# Patient Record
Sex: Female | Born: 1935 | Hispanic: No | Smoking: Never smoker
Health system: Southern US, Community
[De-identification: ages and names within clinical notes are randomized; demographics above are authoritative.]

## PROBLEM LIST (undated history)

## (undated) DIAGNOSIS — F32A Depression, unspecified: Secondary | ICD-10-CM

## (undated) DIAGNOSIS — E7800 Pure hypercholesterolemia, unspecified: Secondary | ICD-10-CM

## (undated) DIAGNOSIS — F0390 Unspecified dementia without behavioral disturbance: Secondary | ICD-10-CM

## (undated) DIAGNOSIS — F329 Major depressive disorder, single episode, unspecified: Secondary | ICD-10-CM

---

## 2010-02-21 ENCOUNTER — Ambulatory Visit (HOSPITAL_BASED_OUTPATIENT_CLINIC_OR_DEPARTMENT_OTHER): Admission: RE | Admit: 2010-02-21 | Discharge: 2010-02-21 | Payer: Self-pay | Admitting: Orthopedic Surgery

## 2010-02-21 ENCOUNTER — Ambulatory Visit: Payer: Self-pay | Admitting: Diagnostic Radiology

## 2010-04-26 ENCOUNTER — Encounter
Admission: RE | Admit: 2010-04-26 | Discharge: 2010-06-26 | Payer: Self-pay | Source: Home / Self Care | Attending: Orthopedic Surgery | Admitting: Orthopedic Surgery

## 2010-07-02 ENCOUNTER — Encounter
Admission: RE | Admit: 2010-07-02 | Discharge: 2010-07-06 | Payer: Self-pay | Source: Home / Self Care | Attending: Orthopedic Surgery | Admitting: Orthopedic Surgery

## 2010-07-05 ENCOUNTER — Encounter: Admit: 2010-07-05 | Payer: Self-pay | Admitting: Orthopedic Surgery

## 2015-12-10 ENCOUNTER — Emergency Department (HOSPITAL_BASED_OUTPATIENT_CLINIC_OR_DEPARTMENT_OTHER): Payer: Medicare Other

## 2015-12-10 ENCOUNTER — Emergency Department (HOSPITAL_BASED_OUTPATIENT_CLINIC_OR_DEPARTMENT_OTHER)
Admission: EM | Admit: 2015-12-10 | Discharge: 2015-12-10 | Disposition: A | Discharge status: Home / Self Care | Payer: Medicare Other | Attending: Emergency Medicine | Admitting: Emergency Medicine

## 2015-12-10 ENCOUNTER — Encounter (HOSPITAL_BASED_OUTPATIENT_CLINIC_OR_DEPARTMENT_OTHER): Payer: Self-pay | Admitting: *Deleted

## 2015-12-10 DIAGNOSIS — Z79891 Long term (current) use of opiate analgesic: Secondary | ICD-10-CM | POA: Insufficient documentation

## 2015-12-10 DIAGNOSIS — Y9301 Activity, walking, marching and hiking: Secondary | ICD-10-CM | POA: Diagnosis not present

## 2015-12-10 DIAGNOSIS — Y92009 Unspecified place in unspecified non-institutional (private) residence as the place of occurrence of the external cause: Secondary | ICD-10-CM | POA: Diagnosis not present

## 2015-12-10 DIAGNOSIS — Y999 Unspecified external cause status: Secondary | ICD-10-CM | POA: Diagnosis not present

## 2015-12-10 DIAGNOSIS — S0101XA Laceration without foreign body of scalp, initial encounter: Secondary | ICD-10-CM | POA: Diagnosis not present

## 2015-12-10 DIAGNOSIS — F329 Major depressive disorder, single episode, unspecified: Secondary | ICD-10-CM | POA: Diagnosis not present

## 2015-12-10 DIAGNOSIS — E7800 Pure hypercholesterolemia, unspecified: Secondary | ICD-10-CM | POA: Insufficient documentation

## 2015-12-10 DIAGNOSIS — M25511 Pain in right shoulder: Secondary | ICD-10-CM | POA: Insufficient documentation

## 2015-12-10 DIAGNOSIS — Z79899 Other long term (current) drug therapy: Secondary | ICD-10-CM | POA: Diagnosis not present

## 2015-12-10 DIAGNOSIS — W19XXXA Unspecified fall, initial encounter: Secondary | ICD-10-CM

## 2015-12-10 DIAGNOSIS — F0390 Unspecified dementia without behavioral disturbance: Secondary | ICD-10-CM | POA: Insufficient documentation

## 2015-12-10 DIAGNOSIS — W1839XA Other fall on same level, initial encounter: Secondary | ICD-10-CM | POA: Insufficient documentation

## 2015-12-10 HISTORY — DX: Unspecified dementia, unspecified severity, without behavioral disturbance, psychotic disturbance, mood disturbance, and anxiety: F03.90

## 2015-12-10 HISTORY — DX: Pure hypercholesterolemia, unspecified: E78.00

## 2015-12-10 HISTORY — DX: Depression, unspecified: F32.A

## 2015-12-10 HISTORY — DX: Major depressive disorder, single episode, unspecified: F32.9

## 2015-12-10 NOTE — ED Notes (Signed)
Pt reports that she fell at home today.  Has a laceration with no active bleeding noted.  Pt reports that she remembers everything that happened.  Denies LOC.

## 2015-12-10 NOTE — ED Provider Notes (Addendum)
CSN: [id number]     Arrival date & time 12/10/15  1914 History  By signing my name below, I, Suzanne Wright, attest that this documentation has been prepared under the direction and in the presence of Tilden FossaElizabeth Karinne Schmader, MD. Electronically Signed: Soijett Wright, ED Scribe. 12/10/2015. 9:26 PM.   Chief Complaint  Patient presents with  . Fall      The history is provided by the patient. No language interpreter was used.    Suzanne Wright is a 82 y.o. female who presents to the Emergency Department complaining of a fall onset 4 PM today. Pt notes that she fell to her side while ambulating to her kitchen counter. Pt reports that she lives by herself. Pt is having associated symptoms of hitting her head, right shoulder pain, laceration to left sided head, and right knee bruising/pain due to previous fall. She notes that she has not tried any medications for the relief of her symptoms. She denies CP, dizziness, LOC, vomiting, and any other symptoms. Denies being on blood thinners at this time.    Past Medical History  Diagnosis Date  . Hypercholesteremia   . Dementia   . Depression    History reviewed. No pertinent past surgical history. History reviewed. No pertinent family history. Social History  Substance Use Topics  . Smoking status: Never Smoker   . Smokeless tobacco: None  . Alcohol Use: No   OB History    No data available     Review of Systems  Cardiovascular: Negative for chest pain.  Gastrointestinal: Negative for vomiting.  Musculoskeletal: Positive for arthralgias (right shoulder).  Skin: Positive for wound (laceration to left sided head).  Neurological: Negative for dizziness and syncope.  All other systems reviewed and are negative.     Allergies  Review of patient's allergies indicates no known allergies.  Home Medications   Prior to Admission medications   Medication Sig Start Date End Date Taking? Authorizing Provider  ALPRAZolam Prudy Feeler(XANAX) 1 MG tablet Take  1 mg by mouth at bedtime as needed for anxiety.   Yes Historical Provider, MD  atorvastatin (LIPITOR) 40 MG tablet Take 40 mg by mouth daily.   Yes Historical Provider, MD  buPROPion (WELLBUTRIN) 75 MG tablet Take 75 mg by mouth 2 (two) times daily.   Yes Historical Provider, MD  donepezil (ARICEPT) 10 MG tablet Take 10 mg by mouth at bedtime.   Yes Historical Provider, MD  fluticasone (FLOVENT DISKUS) 50 MCG/BLIST diskus inhaler Inhale 1 puff into the lungs 2 (two) times daily.   Yes Historical Provider, MD  gabapentin (NEURONTIN) 300 MG capsule Take 300 mg by mouth 3 (three) times daily.   Yes Historical Provider, MD  meclizine (ANTIVERT) 25 MG tablet Take 25 mg by mouth 3 (three) times daily as needed for dizziness.   Yes Historical Provider, MD  sertraline (ZOLOFT) 50 MG tablet Take 50 mg by mouth daily.   Yes Historical Provider, MD  traMADol (ULTRAM) 50 MG tablet Take by mouth every 6 (six) hours as needed.   Yes Historical Provider, MD   BP 138/64 mmHg  Pulse 65  Temp(Src) 97.8 F (36.6 C) (Oral)  Resp 16  Ht 5\' 1"  (1.549 m)  Wt 125 lb (56.7 kg)  BMI 23.63 kg/m2  SpO2 98% Physical Exam  Constitutional: She is oriented to person, place, and time. She appears well-developed and well-nourished.  HENT:  Head: Normocephalic. Head is with laceration.  2 cm linear laceration to left posterior scalp. Small abrasion  to left posterior scalp.  Cardiovascular: Normal rate and regular rhythm.   No murmur heard. Pulmonary/Chest: Effort normal and breath sounds normal. No respiratory distress.  Abdominal: Soft. There is no tenderness. There is no rebound and no guarding.  Musculoskeletal: She exhibits no edema or tenderness.  No C, T, L spinal tenderness. No shoulder tenderness.  Ecchymosis to the right knee with ROM intact, no local tenderness  Neurological: She is alert and oriented to person, place, and time.  Skin: Skin is warm and dry.  Psychiatric: She has a normal mood and affect. Her  behavior is normal.  Nursing note and vitals reviewed.   ED Course  Procedures (including critical care time) DIAGNOSTIC STUDIES: Oxygen Saturation is 97% on RA, nl by my interpretation.    COORDINATION OF CARE: 9:24 PM Discussed treatment plan with pt at bedside which includes laceration repaired, CT head, CT neck, and pt agreed to plan.  LACERATION REPAIR PROCEDURE NOTE The patient's identification was confirmed and consent was obtained. This procedure was performed by Tilden Fossa, MD at 9:21 PM. Site: left posterior scalp   Anesthetic used (type and amt): N/A Staples: 2 Length: 2 cm Technique:Single interrupted Complexity: SImple Antibx ointment applied: N/A Tetanus UTD or ordered: N/A Site irrigated with sterile water, explored without evidence of foreign body, wound well approximated.  Patient tolerated procedure well without complications. Instructions for care discussed verbally and patient provided with additional written instructions for homecare and f/u.   Labs Review Labs Reviewed - No data to display  Imaging Review Ct Head Wo Contrast  12/10/2015  CLINICAL DATA:  Status post fall, hitting head, with laceration at the left side of the head. Right shoulder pain. Concern for cervical spine injury. Initial encounter. EXAM: CT HEAD WITHOUT CONTRAST CT CERVICAL SPINE WITHOUT CONTRAST TECHNIQUE: Multidetector CT imaging of the head and cervical spine was performed following the standard protocol without intravenous contrast. Multiplanar CT image reconstructions of the cervical spine were also generated. COMPARISON:  CT of the head and cervical spine performed 06/07/2015 FINDINGS: CT HEAD FINDINGS There is no evidence of acute infarction, mass lesion, or intra- or extra-axial hemorrhage on CT. Prominence of the ventricles and sulci reflects mild to moderate cortical volume loss. Mild cerebellar atrophy is noted. Scattered periventricular and subcortical white matter change  likely reflects small vessel ischemic microangiopathy. Small chronic lacunar infarcts are seen in the basal ganglia bilaterally. The brainstem and fourth ventricle are within normal limits. The cerebral hemispheres demonstrate grossly normal gray-white differentiation. No mass effect or midline shift is seen. There is no evidence of fracture; visualized osseous structures are unremarkable in appearance. The orbits are within normal limits. There is mild partial opacification of the right mastoid air cells. The paranasal sinuses and left mastoid air cells are well-aerated. Minimal soft tissue swelling is noted overlying the high left parietal calvarium, with minimal soft tissue air and skin staples. CT CERVICAL SPINE FINDINGS There is no evidence of acute fracture or subluxation. Mild degenerative change is noted about the dens. Multilevel disc space narrowing is noted along the lower cervical spine, with scattered anterior and posterior disc osteophyte complexes, and underlying facet disease. There is mild grade 1 retrolisthesis of C5 on C6 and of C6 on C7. Vertebral bodies demonstrate normal height. Prevertebral soft tissues are within normal limits. The thyroid gland is unremarkable in appearance. The visualized lung apices are clear. No significant soft tissue abnormalities are seen. IMPRESSION: 1. No evidence of traumatic intracranial injury or fracture. 2.  No evidence of acute fracture or subluxation along the cervical spine. 3. Minimal soft tissue swelling overlying the high left parietal calvarium, with minimal soft tissue air and skin staples. 4. Mild to moderate cortical volume loss and scattered small vessel ischemic microangiopathy. 5. Small chronic lacunar infarcts at the basal ganglia bilaterally. 6. Mild degenerative change along the lower cervical spine. 7. Mild partial opacification of the right mastoid air cells. Electronically Signed   By: Roanna Raider M.D.   On: 12/10/2015 22:10   Ct Cervical  Spine Wo Contrast  12/10/2015  CLINICAL DATA:  Status post fall, hitting head, with laceration at the left side of the head. Right shoulder pain. Concern for cervical spine injury. Initial encounter. EXAM: CT HEAD WITHOUT CONTRAST CT CERVICAL SPINE WITHOUT CONTRAST TECHNIQUE: Multidetector CT imaging of the head and cervical spine was performed following the standard protocol without intravenous contrast. Multiplanar CT image reconstructions of the cervical spine were also generated. COMPARISON:  CT of the head and cervical spine performed 06/07/2015 FINDINGS: CT HEAD FINDINGS There is no evidence of acute infarction, mass lesion, or intra- or extra-axial hemorrhage on CT. Prominence of the ventricles and sulci reflects mild to moderate cortical volume loss. Mild cerebellar atrophy is noted. Scattered periventricular and subcortical white matter change likely reflects small vessel ischemic microangiopathy. Small chronic lacunar infarcts are seen in the basal ganglia bilaterally. The brainstem and fourth ventricle are within normal limits. The cerebral hemispheres demonstrate grossly normal gray-white differentiation. No mass effect or midline shift is seen. There is no evidence of fracture; visualized osseous structures are unremarkable in appearance. The orbits are within normal limits. There is mild partial opacification of the right mastoid air cells. The paranasal sinuses and left mastoid air cells are well-aerated. Minimal soft tissue swelling is noted overlying the high left parietal calvarium, with minimal soft tissue air and skin staples. CT CERVICAL SPINE FINDINGS There is no evidence of acute fracture or subluxation. Mild degenerative change is noted about the dens. Multilevel disc space narrowing is noted along the lower cervical spine, with scattered anterior and posterior disc osteophyte complexes, and underlying facet disease. There is mild grade 1 retrolisthesis of C5 on C6 and of C6 on C7. Vertebral  bodies demonstrate normal height. Prevertebral soft tissues are within normal limits. The thyroid gland is unremarkable in appearance. The visualized lung apices are clear. No significant soft tissue abnormalities are seen. IMPRESSION: 1. No evidence of traumatic intracranial injury or fracture. 2. No evidence of acute fracture or subluxation along the cervical spine. 3. Minimal soft tissue swelling overlying the high left parietal calvarium, with minimal soft tissue air and skin staples. 4. Mild to moderate cortical volume loss and scattered small vessel ischemic microangiopathy. 5. Small chronic lacunar infarcts at the basal ganglia bilaterally. 6. Mild degenerative change along the lower cervical spine. 7. Mild partial opacification of the right mastoid air cells. Electronically Signed   By: Roanna Raider M.D.   On: 12/10/2015 22:10   I have personally reviewed and evaluated these images as part of my medical decision-making.   EKG Interpretation None      MDM   Final diagnoses:  Fall, initial encounter  Scalp laceration, initial encounter   Pt here for evaluation of injuries following a mechanical fall with laceration to her scalp.  Wound repaired per procedure note.  Pt reported right shoulder tenderness earlier following the fall but none currently.  She has no shoulder or chest tenderness and can range the right  upper extremity without difficulty.  Discussed wound care for her scalp laceration as well as outpatient follow up and return precautions.    I personally performed the services described in this documentation, which was scribed in my presence. The recorded information has been reviewed and is accurate.    Tilden Fossa, MD 12/11/15 1206  Tilden Fossa, MD 12/11/15 [phone]

## 2015-12-10 NOTE — Discharge Instructions (Signed)
Laceration Care, Adult °A laceration is a cut that goes through all of the layers of the skin and into the tissue that is right under the skin. Some lacerations heal on their own. Others need to be closed with stitches (sutures), staples, skin adhesive strips, or skin glue. Proper laceration care minimizes the risk of infection and helps the laceration to heal better. °HOW TO CARE FOR YOUR LACERATION °If sutures or staples were used: °· Keep the wound clean and dry. °· If you were given a bandage (dressing), you should change it at least one time per day or as told by your health care provider. You should also change it if it becomes wet or dirty. °· Keep the wound completely dry for the first 24 hours or as told by your health care provider. After that time, you may shower or bathe. However, make sure that the wound is not soaked in water until after the sutures or staples have been removed. °· Clean the wound one time each day or as told by your health care provider: °· Wash the wound with soap and water. °· Rinse the wound with water to remove all soap. °· Pat the wound dry with a clean towel. Do not rub the wound. °· After cleaning the wound, apply a thin layer of antibiotic ointment as told by your health care provider. This will help to prevent infection and keep the dressing from sticking to the wound. °· Have the sutures or staples removed as told by your health care provider. °If skin adhesive strips were used: °· Keep the wound clean and dry. °· If you were given a bandage (dressing), you should change it at least one time per day or as told by your health care provider. You should also change it if it becomes dirty or wet. °· Do not get the skin adhesive strips wet. You may shower or bathe, but be careful to keep the wound dry. °· If the wound gets wet, pat it dry with a clean towel. Do not rub the wound. °· Skin adhesive strips fall off on their own. You may trim the strips as the wound heals. Do not  remove skin adhesive strips that are still stuck to the wound. They will fall off in time. °If skin glue was used: °· Try to keep the wound dry, but you may briefly wet it in the shower or bath. Do not soak the wound in water, such as by swimming. °· After you have showered or bathed, gently pat the wound dry with a clean towel. Do not rub the wound. °· Do not do any activities that will make you sweat heavily until the skin glue has fallen off on its own. °· Do not apply liquid, cream, or ointment medicine to the wound while the skin glue is in place. Using those may loosen the film before the wound has healed. °· If you were given a bandage (dressing), you should change it at least one time per day or as told by your health care provider. You should also change it if it becomes dirty or wet. °· If a dressing is placed over the wound, be careful not to apply tape directly over the skin glue. Doing that may cause the glue to be pulled off before the wound has healed. °· Do not pick at the glue. The skin glue usually remains in place for 5-10 days, then it falls off of the skin. °General Instructions °· Take over-the-counter and prescription   medicines only as told by your health care provider. °· If you were prescribed an antibiotic medicine or ointment, take or apply it as told by your doctor. Do not stop using it even if your condition improves. °· To help prevent scarring, make sure to cover your wound with sunscreen whenever you are outside after stitches are removed, after adhesive strips are removed, or when glue remains in place and the wound is healed. Make sure to wear a sunscreen of at least 30 SPF. °· Do not scratch or pick at the wound. °· Keep all follow-up visits as told by your health care provider. This is important. °· Check your wound every day for signs of infection. Watch for: °· Redness, swelling, or pain. °· Fluid, blood, or pus. °· Raise (elevate) the injured area above the level of your heart  while you are sitting or lying down, if possible. °SEEK MEDICAL CARE IF: °· You received a tetanus shot and you have swelling, severe pain, redness, or bleeding at the injection site. °· You have a fever. °· A wound that was closed breaks open. °· You notice a bad smell coming from your wound or your dressing. °· You notice something coming out of the wound, such as wood or glass. °· Your pain is not controlled with medicine. °· You have increased redness, swelling, or pain at the site of your wound. °· You have fluid, blood, or pus coming from your wound. °· You notice a change in the color of your skin near your wound. °· You need to change the dressing frequently due to fluid, blood, or pus draining from the wound. °· You develop a new rash. °· You develop numbness around the wound. °SEEK IMMEDIATE MEDICAL CARE IF: °· You develop severe swelling around the wound. °· Your pain suddenly increases and is severe. °· You develop painful lumps near the wound or on skin that is anywhere on your body. °· You have a red streak going away from your wound. °· The wound is on your hand or foot and you cannot properly move a finger or toe. °· The wound is on your hand or foot and you notice that your fingers or toes look pale or bluish. °  °This information is not intended to replace advice given to you by your health care provider. Make sure you discuss any questions you have with your health care provider. °  °Document Released: 06/17/2005 Document Revised: 11/01/2014 Document Reviewed: 06/13/2014 °[employer] Interactive Patient Education ©2016 [employer] °Fall Prevention in the Home  °Falls can cause injuries and can affect people from all age groups. There are many simple things that you can do to make your home safe and to help prevent falls. °WHAT CAN I DO ON THE OUTSIDE OF MY HOME? °· Regularly repair the edges of walkways and driveways and fix any cracks. °· Remove high doorway thresholds. °· Trim any shrubbery on  the main path into your home. °· Use bright outdoor lighting. °· Clear walkways of debris and clutter, including tools and rocks. °· Regularly check that handrails are securely fastened and in good repair. Both sides of any steps should have handrails. °· Install guardrails along the edges of any raised decks or porches. °· Have leaves, snow, and ice cleared regularly. °· Use sand or salt on walkways during winter months. °· In the garage, clean up any spills right away, including grease or oil spills. °WHAT CAN I DO IN THE BATHROOM? °· Use night   lights. °· Install grab bars by the toilet and in the tub and shower. Do not use towel bars as grab bars. °· Use non-skid mats or decals on the floor of the tub or shower. °· If you need to sit down while you are in the shower, use a plastic, non-slip stool.. °· Keep the floor dry. Immediately clean up any water that spills on the floor. °· Remove soap buildup in the tub or shower on a regular basis. °· Attach bath mats securely with double-sided non-slip rug tape. °· Remove throw rugs and other tripping hazards from the floor. °WHAT CAN I DO IN THE BEDROOM? °· Use night lights. °· Make sure that a bedside light is easy to reach. °· Do not use oversized bedding that drapes onto the floor. °· Have a firm chair that has side arms to use for getting dressed. °· Remove throw rugs and other tripping hazards from the floor. °WHAT CAN I DO IN THE KITCHEN?  °· Clean up any spills right away. °· Avoid walking on wet floors. °· Place frequently used items in easy-to-reach places. °· If you need to reach for something above you, use a sturdy step stool that has a grab bar. °· Keep electrical cables out of the way. °· Do not use floor polish or wax that makes floors slippery. If you have to use wax, make sure that it is non-skid floor wax. °· Remove throw rugs and other tripping hazards from the floor. °WHAT CAN I DO IN THE STAIRWAYS? °· Do not leave any items on the stairs. °· Make  sure that there are handrails on both sides of the stairs. Fix handrails that are broken or loose. Make sure that handrails are as long as the stairways. °· Check any carpeting to make sure that it is firmly attached to the stairs. Fix any carpet that is loose or worn. °· Avoid having throw rugs at the top or bottom of stairways, or secure the rugs with carpet tape to prevent them from moving. °· Make sure that you have a light switch at the top of the stairs and the bottom of the stairs. If you do not have them, have them installed. °WHAT ARE SOME OTHER FALL PREVENTION TIPS? °· Wear closed-toe shoes that fit well and support your feet. Wear shoes that have rubber soles or low heels. °· When you use a stepladder, make sure that it is completely opened and that the sides are firmly locked. Have someone hold the ladder while you are using it. Do not climb a closed stepladder. °· Add color or contrast paint or tape to grab bars and handrails in your home. Place contrasting color strips on the first and last steps. °· Use mobility aids as needed, such as canes, walkers, scooters, and crutches. °· Turn on lights if it is dark. Replace any light bulbs that burn out. °· Set up furniture so that there are clear paths. Keep the furniture in the same spot. °· Fix any uneven floor surfaces. °· Choose a carpet design that does not hide the edge of steps of a stairway. °· Be aware of any and all pets. °· Review your medicines with your healthcare provider. Some medicines can cause dizziness or changes in blood pressure, which increase your risk of falling. °Talk with your health care provider about other ways that you can decrease your risk of falls. This may include working with a physical therapist or trainer to improve your strength, balance, and   endurance. °  °This information is not intended to replace advice given to you by your health care provider. Make sure you discuss any questions you have with your health care  provider. °  °Document Released: 06/07/2002 Document Revised: 11/01/2014 Document Reviewed: 07/22/2014 °[employer] Interactive Patient Education ©2016 [employer] ° °

## 2017-01-29 IMAGING — CT CT HEAD W/O CM
3 of 7 series · 12 of 47 positions shown, 14 images · non-contrast
Comparison: CT of the head and cervical spine performed 06/07/2015

CLINICAL DATA: Status post fall, hitting head, with laceration at
the left side of the head. Right shoulder pain. Concern for cervical
spine injury. Initial encounter.

EXAM:
CT HEAD WITHOUT CONTRAST
CT CERVICAL SPINE WITHOUT CONTRAST
TECHNIQUE: Multidetector CT imaging of the head and cervical spine was
performed following the standard protocol without intravenous
contrast. Multiplanar CT image reconstructions of the cervical spine
were also generated.

[Series 9: coronals · coronal · 0.27mm/px · 3 of 74 slices shown]
[im 20/74  brain]
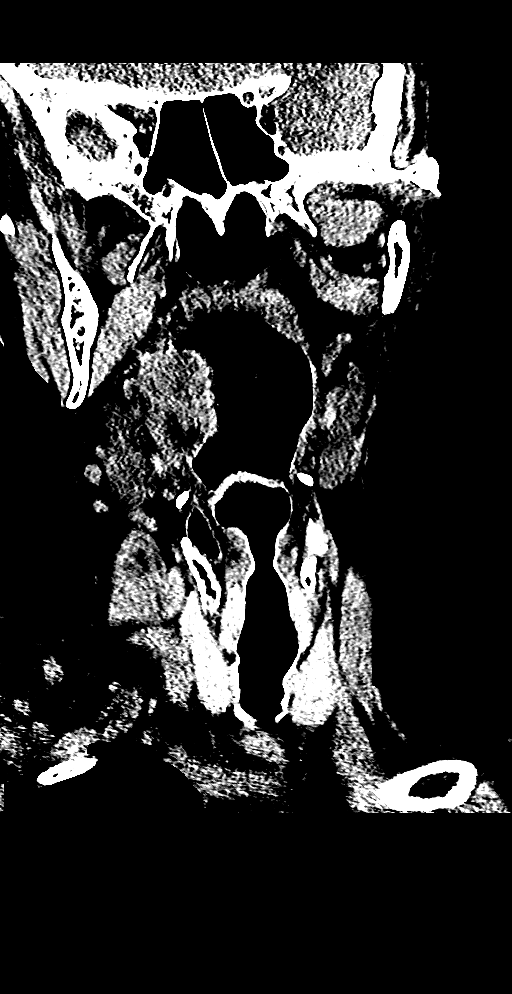
[im 33/74  brain]
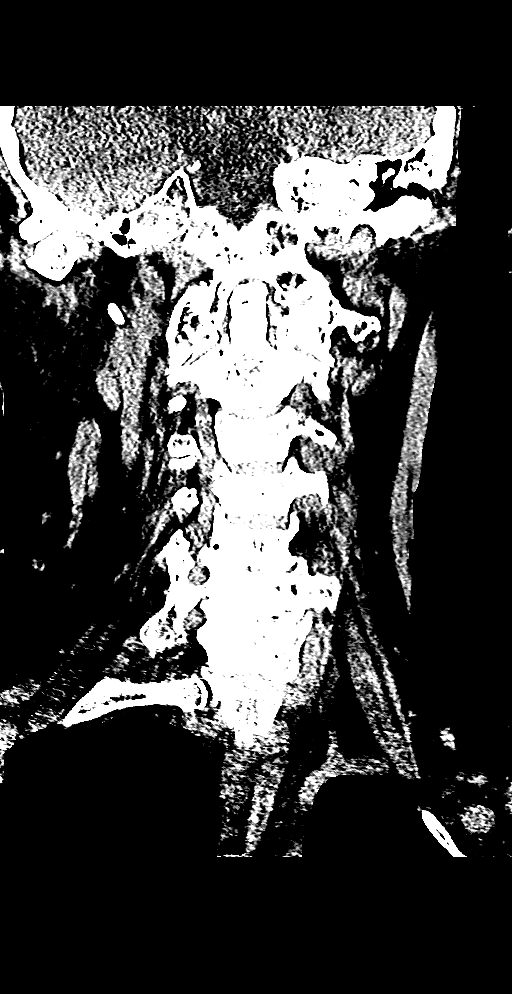
[im 47/74  brain]
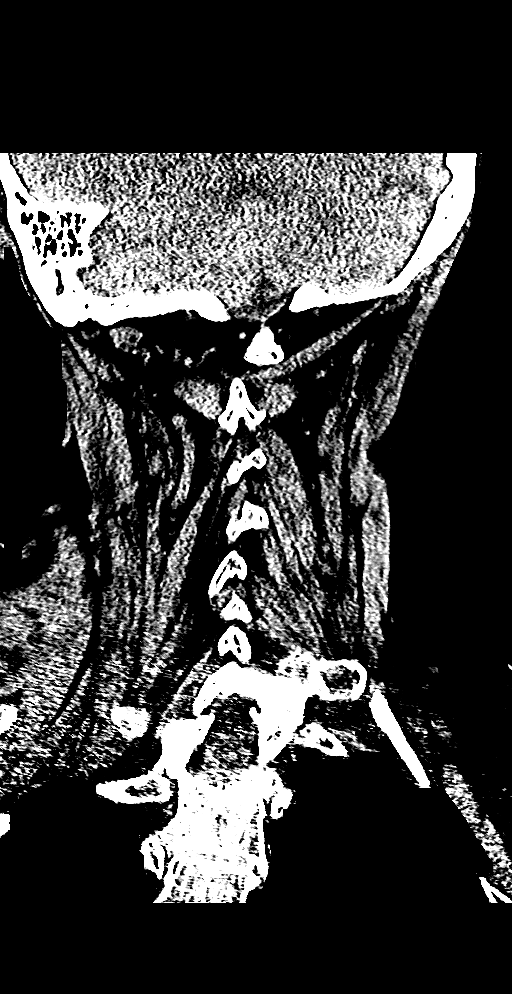

[Series 10: sagittals · sagittal · 0.41mm/px · 2 of 60 slices shown]
[im 20/60  brain]
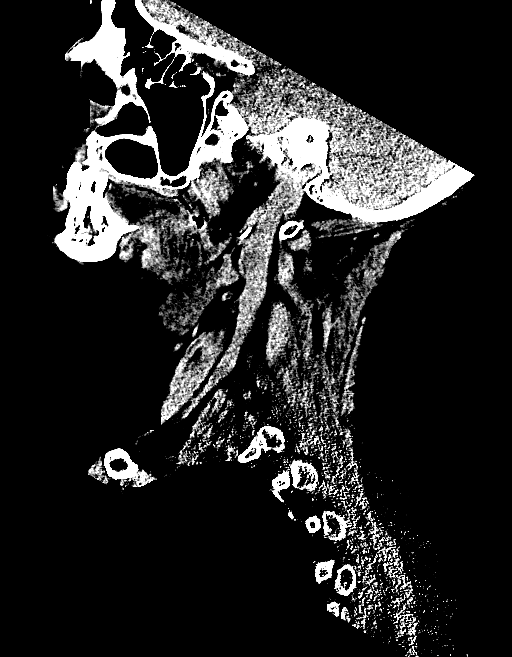
[im 40/60  brain]
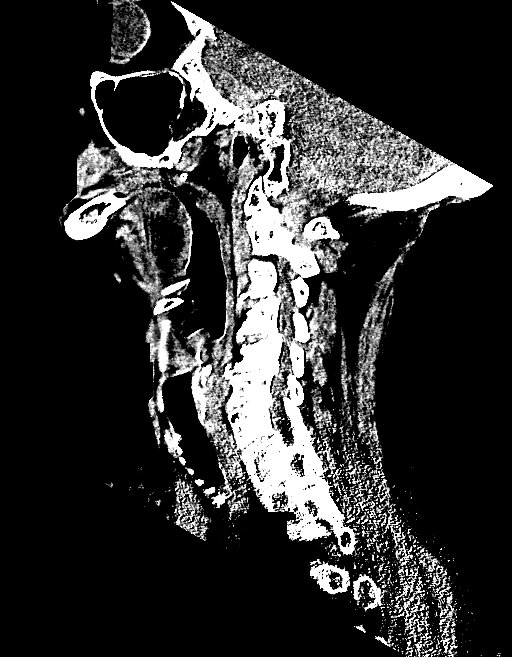

[Series 11: orthogonals · axial · 0.46mm/px · z∈[-391,-217]mm · 7 of 117 slices shown, 9 images]
[im 9/117  brain]
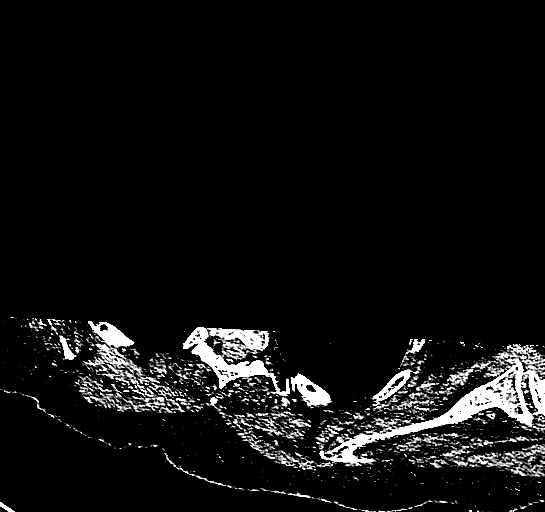
[im 9/117  bone]
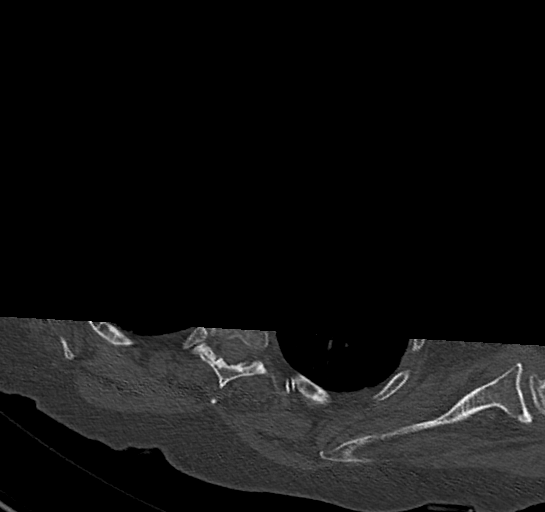
[im 25/117  brain]
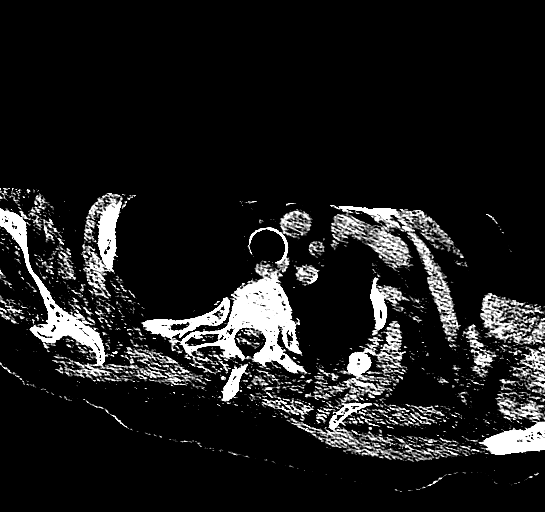
[im 42/117  brain]
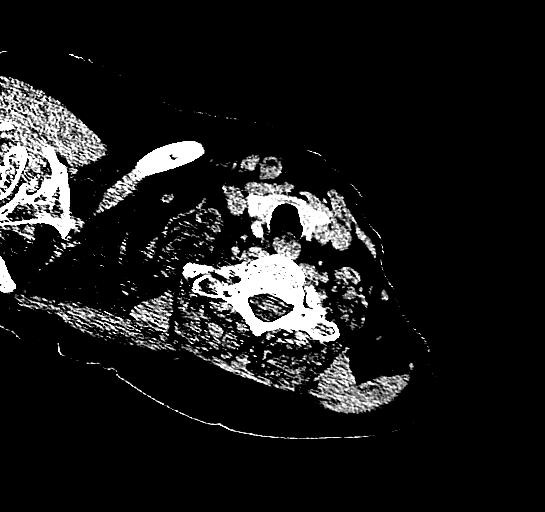
[im 59/117  brain]
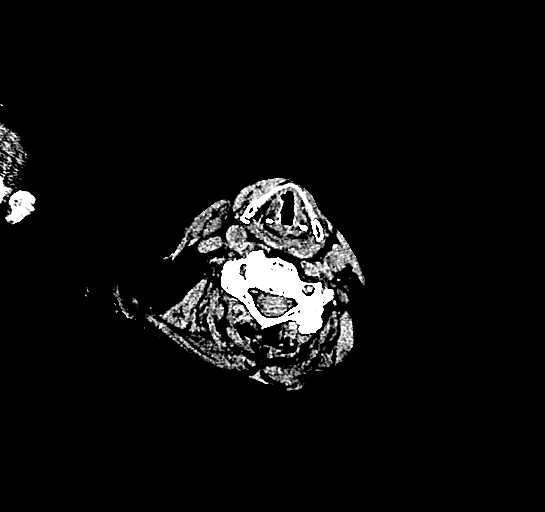
[im 75/117  brain]
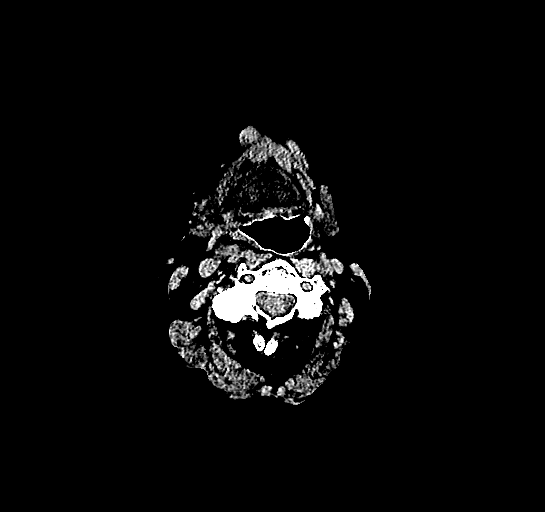
[im 75/117  bone]
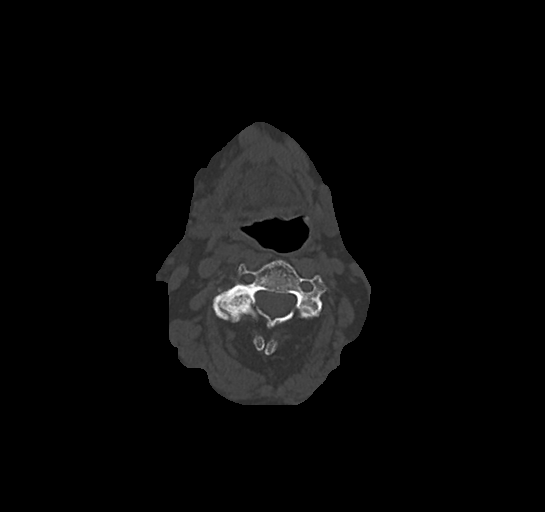
[im 92/117  brain]
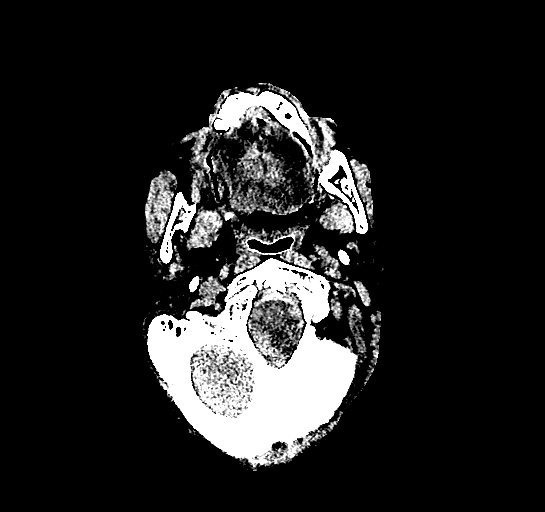
[im 108/117  brain]
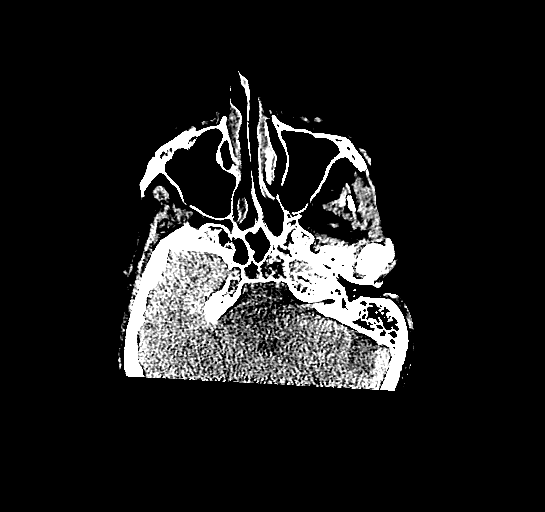

[12 of 47 positions shown; findings below may reference images not displayed]

FINDINGS: CT HEAD FINDINGS

There is no evidence of acute infarction, mass lesion, or intra- or
extra-axial hemorrhage on CT.

Prominence of the ventricles and sulci reflects mild to moderate
cortical volume loss. Mild cerebellar atrophy is noted. Scattered
periventricular and subcortical white matter change likely reflects
small vessel ischemic microangiopathy. Small chronic lacunar
infarcts are seen in the basal ganglia bilaterally.

The brainstem and fourth ventricle are within normal limits. The
cerebral hemispheres demonstrate grossly normal gray-white
differentiation. No mass effect or midline shift is seen.

There is no evidence of fracture; visualized osseous structures are
unremarkable in appearance. The orbits are within normal limits.
There is mild partial opacification of the right mastoid air cells.
The paranasal sinuses and left mastoid air cells are well-aerated.
Minimal soft tissue swelling is noted overlying the high left
parietal calvarium, with minimal soft tissue air and skin staples.

CT CERVICAL SPINE FINDINGS

There is no evidence of acute fracture or subluxation. Mild
degenerative change is noted about the dens. Multilevel disc space
narrowing is noted along the lower cervical spine, with scattered
anterior and posterior disc osteophyte complexes, and underlying
facet disease. There is mild grade 1 retrolisthesis of C5 on C6 and
of C6 on C7. Vertebral bodies demonstrate normal height.
Prevertebral soft tissues are within normal limits.

The thyroid gland is unremarkable in appearance. The visualized lung
apices are clear. No significant soft tissue abnormalities are seen.
IMPRESSION: 1. No evidence of traumatic intracranial injury or fracture.
2. No evidence of acute fracture or subluxation along the cervical
spine.
3. Minimal soft tissue swelling overlying the high left parietal
calvarium, with minimal soft tissue air and skin staples.
4. Mild to moderate cortical volume loss and scattered small vessel
ischemic microangiopathy.
5. Small chronic lacunar infarcts at the basal ganglia bilaterally.
6. Mild degenerative change along the lower cervical spine.
7. Mild partial opacification of the right mastoid air cells.

## 2021-10-29 DEATH — deceased
